# Patient Record
Sex: Female | Born: 1980 | Race: White | Hispanic: Yes | Marital: Single | Smoking: Never smoker
Health system: Southern US, Community
[De-identification: ages and names within clinical notes are randomized; demographics above are authoritative.]

## PROBLEM LIST (undated history)

## (undated) DIAGNOSIS — G43909 Migraine, unspecified, not intractable, without status migrainosus: Secondary | ICD-10-CM

## (undated) HISTORY — DX: Migraine, unspecified, not intractable, without status migrainosus: G43.909

---

## 2007-01-14 ENCOUNTER — Emergency Department (HOSPITAL_COMMUNITY): Admission: EM | Admit: 2007-01-14 | Discharge: 2007-01-14 | Payer: Self-pay | Admitting: Emergency Medicine

## 2007-12-17 ENCOUNTER — Emergency Department (HOSPITAL_COMMUNITY): Admission: EM | Admit: 2007-12-17 | Discharge: 2007-12-17 | Payer: Self-pay | Admitting: Emergency Medicine

## 2011-08-22 ENCOUNTER — Encounter: Payer: Self-pay | Admitting: Emergency Medicine

## 2011-08-22 ENCOUNTER — Emergency Department (HOSPITAL_COMMUNITY): Payer: Self-pay

## 2011-08-22 ENCOUNTER — Emergency Department (HOSPITAL_COMMUNITY)
Admission: EM | Admit: 2011-08-22 | Discharge: 2011-08-22 | Disposition: A | Discharge status: Home / Self Care | Payer: Self-pay | Attending: Emergency Medicine | Admitting: Emergency Medicine

## 2011-08-22 DIAGNOSIS — I10 Essential (primary) hypertension: Secondary | ICD-10-CM | POA: Insufficient documentation

## 2011-08-22 DIAGNOSIS — H9209 Otalgia, unspecified ear: Secondary | ICD-10-CM | POA: Insufficient documentation

## 2011-08-22 DIAGNOSIS — R51 Headache: Secondary | ICD-10-CM | POA: Insufficient documentation

## 2011-08-22 DIAGNOSIS — J40 Bronchitis, not specified as acute or chronic: Secondary | ICD-10-CM

## 2011-08-22 MED ORDER — CHLORPHENIRAMINE MALEATE CR 8 MG PO CPCR: ORAL_CAPSULE | ORAL | Status: DC

## 2011-08-22 MED ORDER — DOXYCYCLINE HYCLATE 100 MG PO TABS
100.0000 mg | ORAL_TABLET | Freq: Once | ORAL | Status: AC
  Administered 2011-08-22: 100 mg via ORAL
  Filled 2011-08-22: qty 1

## 2011-08-22 MED ORDER — HYDROCOD POLST-CHLORPHEN POLST 10-8 MG/5ML PO LQCR
5.0000 mL | Freq: Once | ORAL | Status: AC
  Administered 2011-08-22: 5 mL via ORAL
  Filled 2011-08-22: qty 5

## 2011-08-22 MED ORDER — DOXYCYCLINE HYCLATE 100 MG PO CAPS: ORAL_CAPSULE | ORAL | Status: DC

## 2011-08-22 MED ORDER — PREDNISONE 10 MG PO TABS: ORAL_TABLET | ORAL | Status: DC

## 2011-08-22 MED ORDER — ALBUTEROL SULFATE HFA 108 (90 BASE) MCG/ACT IN AERS
2.0000 | INHALATION_SPRAY | RESPIRATORY_TRACT | Status: DC
  Administered 2011-08-22: 2 via RESPIRATORY_TRACT
  Filled 2011-08-22 (×2): qty 6.7

## 2011-08-22 NOTE — ED Notes (Signed)
Pt c/o bilateral ear and and cold since October.

## 2011-08-22 NOTE — ED Provider Notes (Signed)
History     CSN: [id number] Arrival date & time: 08/22/2011 10:28 AM   First MD Initiated Contact with Patient 08/22/11 1250      Chief Complaint  Patient presents with  . Otalgia    (Consider location/radiation/quality/duration/timing/severity/associated sxs/prior treatment) Patient is a 30 y.o. female presenting with ear pain. The history is provided by the patient.  Otalgia This is a recurrent problem. The current episode started more than 1 week ago. There is pain in both ears. The problem occurs daily. The problem has been gradually worsening. The maximum temperature recorded prior to her arrival was 100 to 100.9 F. The fever has been present for 1 to 2 days. The pain is mild. Associated symptoms include headaches, rhinorrhea and sore throat. Pertinent negatives include no abdominal pain, no vomiting, no neck pain, no cough and no rash. Her past medical history does not include chronic ear infection, hearing loss or tympanostomy tube.    History reviewed. No pertinent past medical history.  History reviewed. No pertinent past surgical history.  History reviewed. No pertinent family history.  History  Substance Use Topics  . Smoking status: Not on file  . Smokeless tobacco: Not on file  . Alcohol Use: No    OB History    Grav Para Term Preterm Abortions TAB SAB Ect Mult Living                  Review of Systems  Constitutional: Negative for activity change.       All ROS Neg except as noted in HPI  HENT: Positive for ear pain, sore throat and rhinorrhea. Negative for nosebleeds and neck pain.   Eyes: Negative for photophobia and discharge.  Respiratory: Negative for cough, shortness of breath and wheezing.   Cardiovascular: Negative for chest pain and palpitations.  Gastrointestinal: Negative for vomiting, abdominal pain and blood in stool.  Genitourinary: Negative for dysuria, frequency and hematuria.  Musculoskeletal: Negative for back pain and arthralgias.    Skin: Negative.  Negative for rash.  Neurological: Positive for headaches. Negative for dizziness, seizures and speech difficulty.  Psychiatric/Behavioral: Negative for hallucinations and confusion.    Allergies  Review of patient's allergies indicates no known allergies.  Home Medications   Current Outpatient Rx  Name Route Sig Dispense Refill  . IBUPROFEN 200 MG PO TABS Oral Take 200 mg by mouth every 6 (six) hours as needed. pain     . CHLORPHENIRAMINE MALEATE CR 8 MG PO CPCR  1 po tid for congestion  Please label in SPanish 20 capsule 0  . DOXYCYCLINE HYCLATE 100 MG PO CAPS  1 po bid with food       Please label in Spanish 14 capsule 0  . PREDNISONE 10 MG PO TABS  2 po daily with food       Please label in Spanish 12 tablet 0    BP 116/71  Pulse 85  Temp(Src) 98.4 F (36.9 C) (Oral)  Resp 18  Ht 5\' 4"  (1.626 m)  Wt 140 lb (63.504 kg)  BMI 24.03 kg/m2  SpO2 96%  LMP 08/06/2011  Physical Exam  Nursing note and vitals reviewed. Constitutional: She is oriented to person, place, and time. She appears well-developed and well-nourished.  Non-toxic appearance. No distress.  HENT:  Head: Normocephalic.  Right Ear: Tympanic membrane and external ear normal.  Left Ear: Tympanic membrane and external ear normal.       Mild to mod nasal congestion. No tonsilar abscess or exudate.  Eyes: EOM and lids are normal. Pupils are equal, round, and reactive to light.  Neck: Normal range of motion. Neck supple. Carotid bruit is not present.  Cardiovascular: Normal rate, regular rhythm, normal heart sounds, intact distal pulses and normal pulses.   Pulmonary/Chest: No respiratory distress. She has rhonchi.  Abdominal: Soft. Bowel sounds are normal. There is no tenderness. There is no guarding.  Musculoskeletal: Normal range of motion.  Lymphadenopathy:       Head (right side): No submandibular adenopathy present.       Head (left side): No submandibular adenopathy present.    She has no  cervical adenopathy.  Neurological: She is alert and oriented to person, place, and time. She has normal strength. No cranial nerve deficit or sensory deficit.  Skin: Skin is warm and dry.  Psychiatric: She has a normal mood and affect. Her speech is normal.    ED Course  Procedures (including critical care time)  Labs Reviewed - No data to display No results found.   1. Otalgia   2. Bronchitis       MDM  I have reviewed nursing notes, vital signs, and all appropriate lab and imaging results for this patient.        Kathie Dike, [place] 08/22/11 1728

## 2011-08-22 NOTE — ED Notes (Signed)
Pt states has had cold like symptoms x 1 month, ear pain x 4 days with bloody drainage from rt ear.  Pt states has productive cough with yellow sputum, expiratory wheezing audible and crackles throughout lung field. Pt speaks very little english. Able to translate effectively.

## 2011-08-24 NOTE — ED Provider Notes (Signed)
Medical screening examination/treatment/procedure(s) were performed by non-physician practitioner and as supervising physician I was immediately available for consultation/collaboration.  Nicoletta Dress. Colon Branch, MD 08/24/11 1121

## 2011-09-09 ENCOUNTER — Encounter (HOSPITAL_COMMUNITY): Payer: Self-pay | Admitting: *Deleted

## 2011-09-09 ENCOUNTER — Emergency Department (HOSPITAL_COMMUNITY)
Admission: EM | Admit: 2011-09-09 | Discharge: 2011-09-09 | Disposition: A | Discharge status: Home / Self Care | Payer: Self-pay | Attending: Emergency Medicine | Admitting: Emergency Medicine

## 2011-09-09 DIAGNOSIS — R509 Fever, unspecified: Secondary | ICD-10-CM | POA: Insufficient documentation

## 2011-09-09 DIAGNOSIS — H9209 Otalgia, unspecified ear: Secondary | ICD-10-CM | POA: Insufficient documentation

## 2011-09-09 DIAGNOSIS — Z79899 Other long term (current) drug therapy: Secondary | ICD-10-CM | POA: Insufficient documentation

## 2011-09-09 DIAGNOSIS — J020 Streptococcal pharyngitis: Secondary | ICD-10-CM | POA: Insufficient documentation

## 2011-09-09 DIAGNOSIS — IMO0001 Reserved for inherently not codable concepts without codable children: Secondary | ICD-10-CM | POA: Insufficient documentation

## 2011-09-09 LAB — RAPID STREP SCREEN (MED CTR MEBANE ONLY): Streptococcus, Group A Screen (Direct): POSITIVE — AB

## 2011-09-09 MED ORDER — IBUPROFEN 800 MG PO TABS
800.0000 mg | ORAL_TABLET | Freq: Once | ORAL | Status: AC
  Administered 2011-09-09: 800 mg via ORAL
  Filled 2011-09-09: qty 1

## 2011-09-09 MED ORDER — AMOXICILLIN 500 MG PO CAPS: 500.0000 mg | ORAL_CAPSULE | Freq: Three times a day (TID) | ORAL | Status: AC

## 2011-09-09 NOTE — ED Notes (Signed)
Fever, sore throat, body aches, and ear pain that started last night.  Last took advil approx 0300.

## 2011-09-09 NOTE — ED Provider Notes (Signed)
Medical screening examination/treatment/procedure(s) were performed by non-physician practitioner and as supervising physician I was immediately available for consultation/collaboration.  Marquez Ceesay T Labrenda Lasky, MD 09/09/11 1449 

## 2011-09-09 NOTE — ED Provider Notes (Signed)
History     CSN: [id number]  Arrival date & time 09/09/11  0806   First MD Initiated Contact with Patient 09/09/11 [phone]      Chief Complaint  Patient presents with  . Sore Throat  . Fever  . Generalized Body Aches    (Consider location/radiation/quality/duration/timing/severity/associated sxs/prior treatment) Patient is a 30 y.o. female presenting with pharyngitis and fever. The history is provided by the patient.  Sore Throat This is a new problem. The current episode started yesterday. The problem occurs constantly. The problem has been unchanged. Associated symptoms include a fever, myalgias and a sore throat. Pertinent negatives include no abdominal pain, arthralgias, chest pain, congestion, headaches, joint swelling, nausea, neck pain, numbness, rash or weakness. Associated symptoms comments: Ear pain. The symptoms are aggravated by swallowing. She has tried NSAIDs for the symptoms. The treatment provided mild relief.  Fever Primary symptoms of the febrile illness include fever and myalgias. Primary symptoms do not include headaches, shortness of breath, abdominal pain, nausea, arthralgias or rash.  The myalgias are not associated with weakness.    History reviewed. No pertinent past medical history.  History reviewed. No pertinent past surgical history.  No family history on file.  History  Substance Use Topics  . Smoking status: Never Smoker   . Smokeless tobacco: Not on file  . Alcohol Use: No    OB History    Grav Para Term Preterm Abortions TAB SAB Ect Mult Living                  Review of Systems  Constitutional: Positive for fever.  HENT: Positive for sore throat. Negative for congestion and neck pain.   Eyes: Negative.   Respiratory: Negative for chest tightness and shortness of breath.   Cardiovascular: Negative for chest pain.  Gastrointestinal: Negative for nausea and abdominal pain.  Genitourinary: Negative.   Musculoskeletal: Positive for  myalgias. Negative for joint swelling and arthralgias.  Skin: Negative.  Negative for rash and wound.  Neurological: Negative for dizziness, weakness, light-headedness, numbness and headaches.  Hematological: Negative.   Psychiatric/Behavioral: Negative.     Allergies  Review of patient's allergies indicates no known allergies.  Home Medications   Current Outpatient Rx  Name Route Sig Dispense Refill  . CHLORPHENIRAMINE MALEATE CR 8 MG PO CPCR  1 po tid for congestion  Please label in SPanish 20 capsule 0  . DOXYCYCLINE HYCLATE 100 MG PO CAPS  1 po bid with food       Please label in Spanish 14 capsule 0  . IBUPROFEN 200 MG PO TABS Oral Take 200 mg by mouth every 6 (six) hours as needed. pain     . PREDNISONE 10 MG PO TABS  2 po daily with food       Please label in Spanish 12 tablet 0    BP 116/69  Pulse 82  Temp(Src) 98.1 F (36.7 C) (Oral)  Resp 16  Wt 141 lb (63.957 kg)  SpO2 100%  LMP 08/06/2011  Physical Exam  Nursing note and vitals reviewed. Constitutional: She is oriented to person, place, and time. She appears well-developed and well-nourished.  HENT:  Head: Normocephalic and atraumatic.  Right Ear: Tympanic membrane normal.  Left Ear: Tympanic membrane normal.  Nose: Nose normal.  Mouth/Throat: Uvula is midline. Posterior oropharyngeal erythema present. No oropharyngeal exudate, posterior oropharyngeal edema or tonsillar abscesses.  Eyes: Conjunctivae are normal.  Neck: Normal range of motion.  Cardiovascular: Normal rate, regular rhythm, normal heart sounds  and intact distal pulses.   Pulmonary/Chest: Effort normal and breath sounds normal. No stridor. She has no wheezes.  Abdominal: Soft. Bowel sounds are normal. There is no tenderness.  Musculoskeletal: Normal range of motion.  Lymphadenopathy:    She has no cervical adenopathy.  Neurological: She is alert and oriented to person, place, and time.  Skin: Skin is warm and dry.  Psychiatric: She has a normal  mood and affect.    ED Course  Procedures (including critical care time)  Labs Reviewed  RAPID STREP SCREEN - Abnormal; Notable for the following:    Streptococcus, Group A Screen (Direct) POSITIVE (*)    All other components within normal limits   No results found.   No diagnosis found.    MDM  Strep positive.  Amoxil.  Motrin or tylenol.          Candis Musa, PA 09/09/11 [phone]

## 2011-09-09 NOTE — ED Notes (Signed)
Pt c/o headache, fever, earache bilaterally, aching all over and sore throat since yesterday. Pt alert and oriented x 3. Skin warm and dry. Color pink. Pt has beefy red throat with white patches.

## 2011-12-18 ENCOUNTER — Other Ambulatory Visit (HOSPITAL_COMMUNITY)
Admission: RE | Admit: 2011-12-18 | Discharge: 2011-12-18 | Disposition: A | Discharge status: Home / Self Care | Payer: Self-pay | Source: Ambulatory Visit | Attending: Unknown Physician Specialty | Admitting: Unknown Physician Specialty

## 2011-12-18 DIAGNOSIS — R87619 Unspecified abnormal cytological findings in specimens from cervix uteri: Secondary | ICD-10-CM | POA: Insufficient documentation

## 2011-12-18 DIAGNOSIS — R87612 Low grade squamous intraepithelial lesion on cytologic smear of cervix (LGSIL): Secondary | ICD-10-CM | POA: Insufficient documentation

## 2012-08-19 ENCOUNTER — Other Ambulatory Visit (HOSPITAL_COMMUNITY)
Admission: RE | Admit: 2012-08-19 | Discharge: 2012-08-19 | Disposition: A | Discharge status: Home / Self Care | Payer: Self-pay | Source: Ambulatory Visit | Attending: Unknown Physician Specialty | Admitting: Unknown Physician Specialty

## 2012-08-19 ENCOUNTER — Other Ambulatory Visit: Payer: Self-pay | Admitting: Nurse Practitioner

## 2012-08-19 DIAGNOSIS — N72 Inflammatory disease of cervix uteri: Secondary | ICD-10-CM | POA: Insufficient documentation

## 2012-08-19 DIAGNOSIS — R87612 Low grade squamous intraepithelial lesion on cytologic smear of cervix (LGSIL): Secondary | ICD-10-CM | POA: Insufficient documentation

## 2012-08-19 DIAGNOSIS — N870 Mild cervical dysplasia: Secondary | ICD-10-CM | POA: Insufficient documentation

## 2012-12-04 ENCOUNTER — Other Ambulatory Visit (HOSPITAL_COMMUNITY): Payer: Self-pay | Admitting: Nurse Practitioner

## 2012-12-04 DIAGNOSIS — R1032 Left lower quadrant pain: Secondary | ICD-10-CM

## 2012-12-11 ENCOUNTER — Ambulatory Visit (HOSPITAL_COMMUNITY)
Admission: RE | Admit: 2012-12-11 | Discharge: 2012-12-11 | Disposition: A | Discharge status: Home / Self Care | Payer: Self-pay | Source: Ambulatory Visit | Attending: Family Medicine | Admitting: Family Medicine

## 2012-12-11 ENCOUNTER — Other Ambulatory Visit (HOSPITAL_COMMUNITY): Payer: Self-pay | Admitting: Nurse Practitioner

## 2012-12-11 DIAGNOSIS — R1032 Left lower quadrant pain: Secondary | ICD-10-CM

## 2013-05-20 IMAGING — US US TRANSVAGINAL NON-OB
1 series · 13 of 25 positions shown · non-contrast
Comparison: None

CLINICAL DATA: Left lower quadrant pain for 2 weeks.  On Depo-
Provera.



[Series 1: us transvaginal non-ob · 0.21mm/px · 13 of 83 slices shown]
[im 1/83]
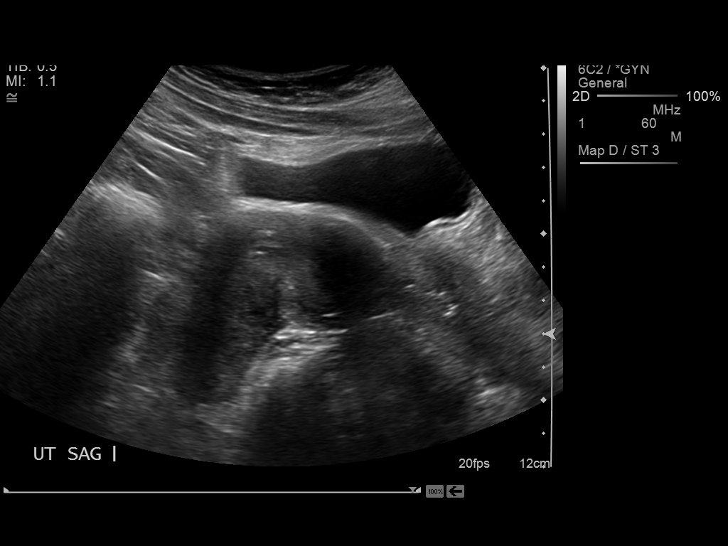
[im 7/83]
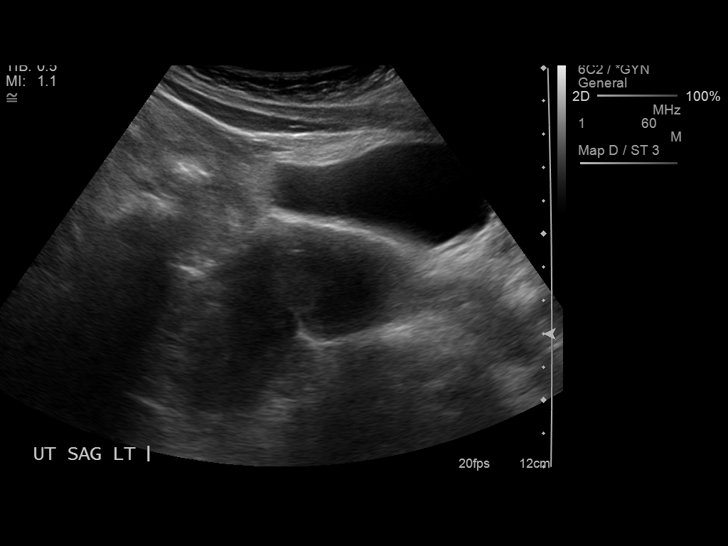
[im 14/83]
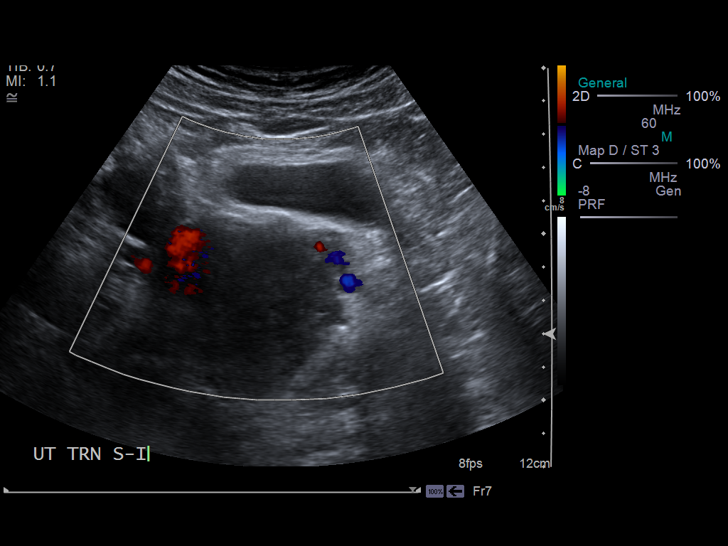
[im 21/83]
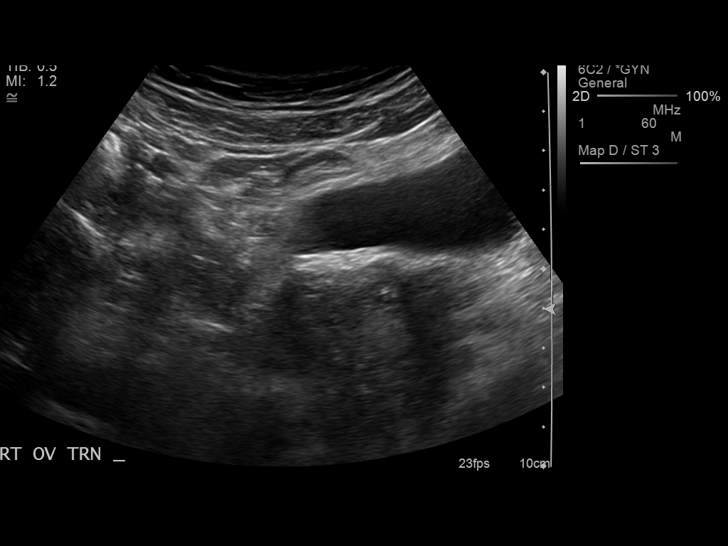
[im 28/83]
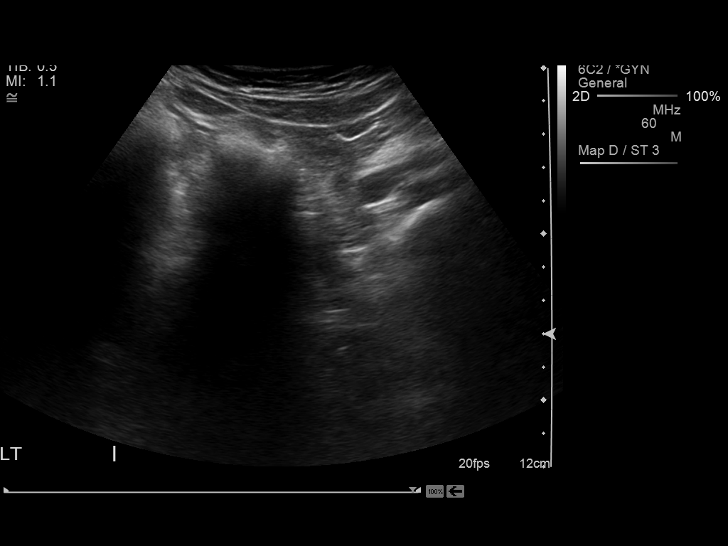
[im 35/83]
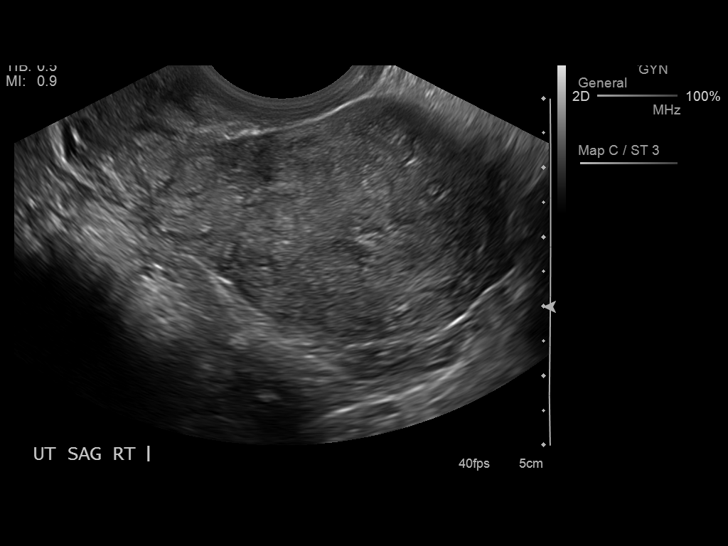
[im 42/83]
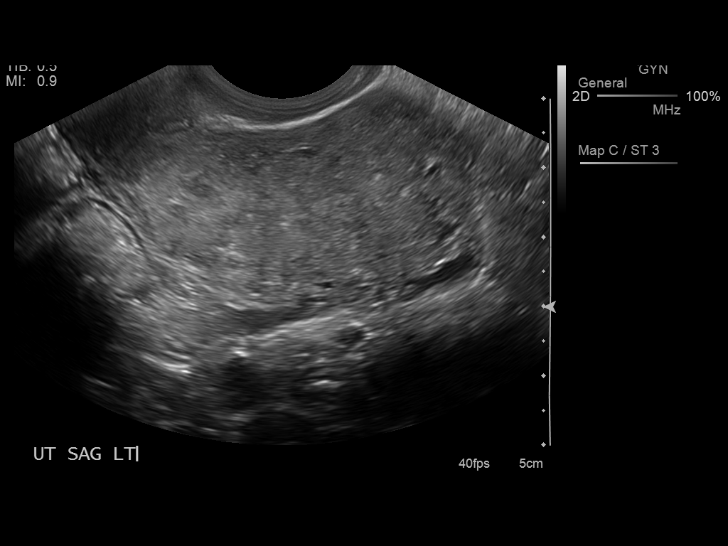
[im 48/83]
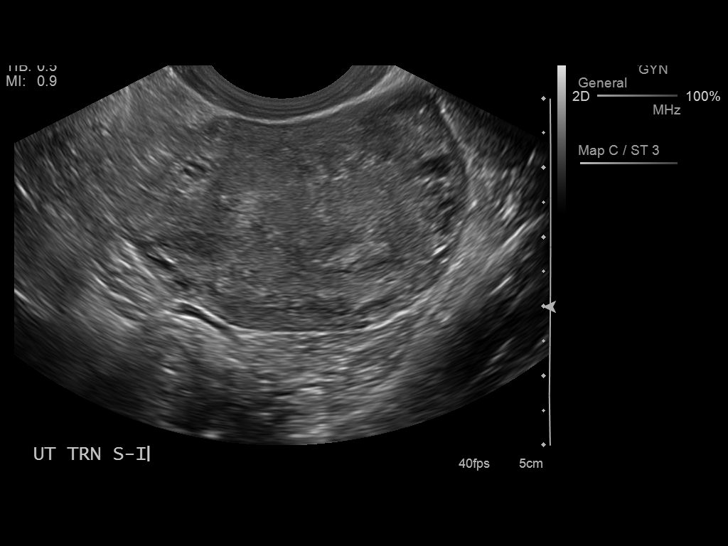
[im 55/83]
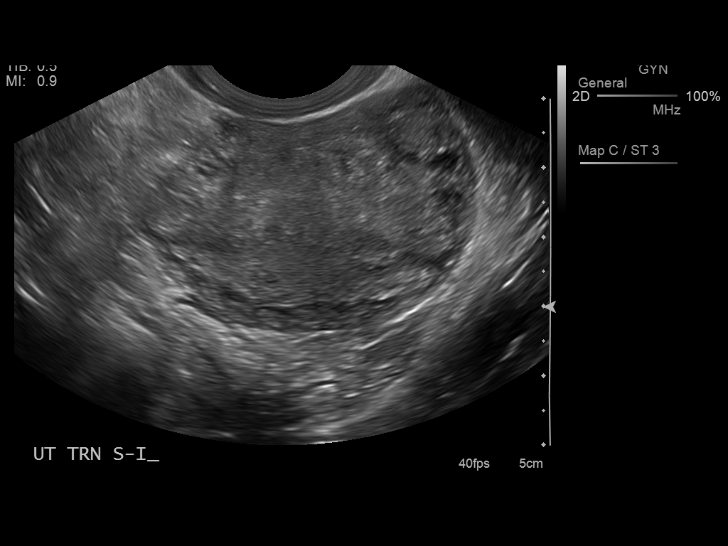
[im 62/83]
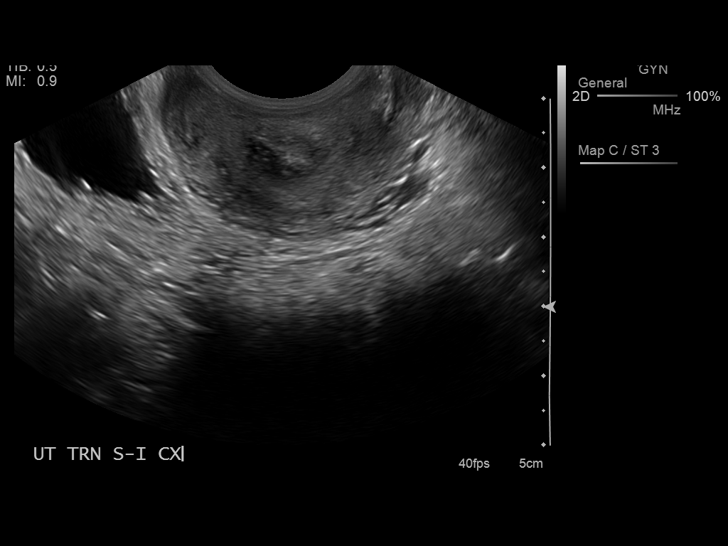
[im 69/83]
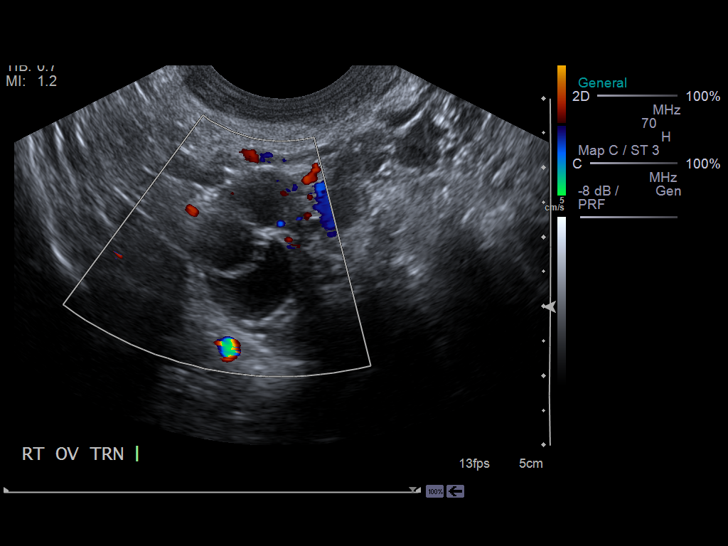
[im 76/83]
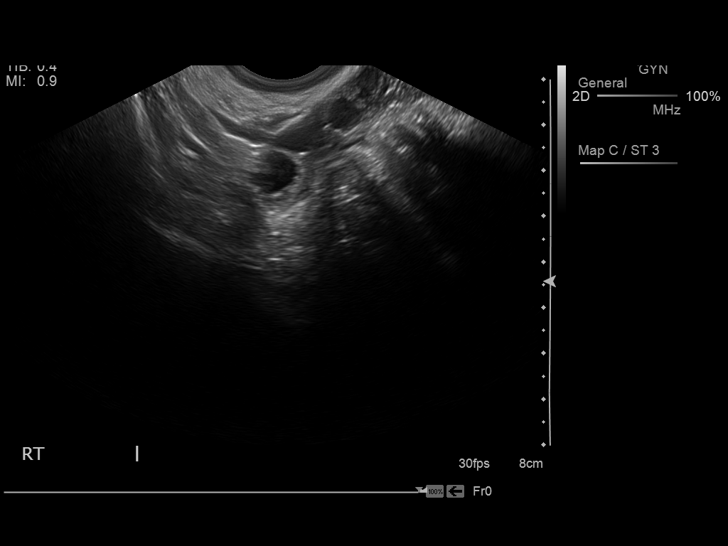
[im 83/83]
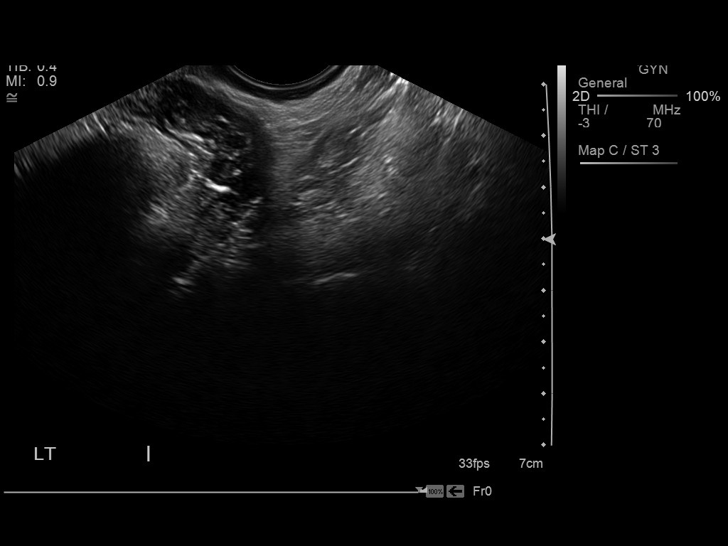

[13 of 25 positions shown; findings below may reference images not displayed]

FINDINGS: Uterus: Is retroverted and retroflexed and has a sagittal length of
6.8 cm, depth of 4.2 cm and width of 5.2 cm.  A homogeneous
myometrium is seen with no focal parenchymal abnormality evident

Endometrium: Is thin and echogenic with a width of 3.1 mm.  No
areas of focal thickening or heterogeneity are seen and this would
correlate with the provided history of Depo-Provera use.

Right ovary:  Measures 2.4 x 1.6 x 1.5 cm and contains several
follicles

Left ovary: Is 2.3 x 1.0 x 0.7 cm and contains a solitary follicle

Other findings: No pelvic fluid or separate adnexal masses are
seen.
IMPRESSION: Unremarkable pelvic ultrasound..

## 2019-06-15 ENCOUNTER — Ambulatory Visit (INDEPENDENT_AMBULATORY_CARE_PROVIDER_SITE_OTHER): Payer: Self-pay | Admitting: Otolaryngology

## 2019-06-15 DIAGNOSIS — H900 Conductive hearing loss, bilateral: Secondary | ICD-10-CM

## 2019-06-15 DIAGNOSIS — H6122 Impacted cerumen, left ear: Secondary | ICD-10-CM

## 2019-07-02 ENCOUNTER — Ambulatory Visit (INDEPENDENT_AMBULATORY_CARE_PROVIDER_SITE_OTHER): Payer: Self-pay | Admitting: Otolaryngology

## 2022-11-23 ENCOUNTER — Other Ambulatory Visit: Payer: Self-pay | Admitting: Nurse Practitioner

## 2022-11-23 DIAGNOSIS — Z1231 Encounter for screening mammogram for malignant neoplasm of breast: Secondary | ICD-10-CM

## 2022-12-12 ENCOUNTER — Ambulatory Visit (HOSPITAL_COMMUNITY)
Admission: RE | Admit: 2022-12-12 | Discharge: 2022-12-12 | Disposition: A | Discharge status: Home / Self Care | Payer: Self-pay | Source: Ambulatory Visit | Attending: Nurse Practitioner | Admitting: Nurse Practitioner

## 2022-12-12 DIAGNOSIS — Z1231 Encounter for screening mammogram for malignant neoplasm of breast: Secondary | ICD-10-CM | POA: Insufficient documentation

## 2022-12-18 ENCOUNTER — Telehealth: Payer: Self-pay

## 2022-12-18 NOTE — Telephone Encounter (Signed)
Telephoned patient at home and mobile number using interpreter # [phone]. Left a voice message with [facility] contact information.

## 2022-12-20 ENCOUNTER — Other Ambulatory Visit (HOSPITAL_COMMUNITY): Payer: Self-pay | Admitting: Obstetrics and Gynecology

## 2022-12-20 DIAGNOSIS — R928 Other abnormal and inconclusive findings on diagnostic imaging of breast: Secondary | ICD-10-CM

## 2022-12-28 ENCOUNTER — Inpatient Hospital Stay: Payer: Self-pay | Attending: Obstetrics and Gynecology | Admitting: Hematology and Oncology

## 2022-12-28 VITALS — BP 128/80 | Wt 156.8 lb

## 2022-12-28 DIAGNOSIS — N6320 Unspecified lump in the left breast, unspecified quadrant: Secondary | ICD-10-CM

## 2022-12-28 NOTE — Progress Notes (Signed)
[name] is a 42 y.o. female who presents to [facility] clinic today with no complaints. Call back from screening mammogram with possible right asymmetry and possible left breast mass.    Pap Smear: Pap not smear completed today. Last Pap smear was 2023 at [facility] clinic and was normal. Per patient has history of an abnormal Pap smear. Last Pap smear result is available in Epic. 08/19/12 - [facility] followed by colposcopy with [facility]/ [facility]. She is scheduled for Pap smear in June this year with Allison Elliott and understands that she will need a Pap smear annually for 3 normal results.    Physical exam: Breasts Breasts symmetrical. No skin abnormalities bilateral breasts. No nipple retraction bilateral breasts. No nipple discharge bilateral breasts. No lymphadenopathy. No lumps palpated bilateral breasts. MS 3D SCR MAMMO BILAT BR (aka MM)  Result Date: 12/13/2022 CLINICAL DATA:  Screening. EXAM: DIGITAL SCREENING BILATERAL MAMMOGRAM WITH TOMOSYNTHESIS AND CAD TECHNIQUE: Bilateral screening digital craniocaudal and mediolateral oblique mammograms were obtained. Bilateral screening digital breast tomosynthesis was performed. The images were evaluated with computer-aided detection. COMPARISON:  None. ACR Breast Density Category c: The breasts are heterogeneously dense, which may obscure small masses. FINDINGS: In the right breast a possible asymmetry requires further evaluation. In the left breast a mass requires further evaluation. IMPRESSION: Further evaluation is suggested for possible asymmetry in the right breast. Further evaluation is suggested for possible mass in the left breast. RECOMMENDATION: Diagnostic mammogram and possibly ultrasound of both breasts. (Code:[id number]) The patient will be contacted regarding the findings, and additional imaging will be scheduled. BI-RADS CATEGORY  0: Incomplete: Need additional imaging evaluation. Electronically Signed   By: Allison Elliott M.D.   On: 12/13/2022 10:31           Pelvic/Bimanual Pap is not indicated today    Smoking History: Patient has never smoked and was not referred to quit line.    Patient Navigation: Patient education provided. Access to services provided for patient through [facility] program. [place] interpreter provided. No transportation provided   Colorectal Cancer Screening: Per patient has never had colonoscopy completed No complaints today.    Breast and Cervical Cancer Risk Assessment: Patient does not have family history of breast cancer, known genetic mutations, or radiation treatment to the chest before age 65. Patient has history of cervical dysplasia, immunocompromised, or DES exposure in-utero.  Risk Assessment   No risk assessment data     A: [facility] exam without pap smear No complaints. Callback for possible asymmetry of the right breast and possible mass in the left breast with normal exam.   P: [facility]. Appointment scheduled 01/03/23.  Pascal Lux, NP 12/28/2022 9:52 AM

## 2022-12-28 NOTE — Patient Instructions (Signed)
Taught Allison Elliott about self breast exam and gave educational materials to take home. Patient did not need a Pap smear today due to last Pap smear was in 2023 per patient. She will repeat Pap smear in June this year as she has a history of abnormal Pap smears with one normal.  Let her know [facility] will cover Pap smears every 5 years unless has a history of abnormal Pap smears. Referred [facility] to [facility] for diagnostic mammogram. Appointment scheduled for 01/03/23. Patient aware of appointment and will be there. Let patient know will follow up with her within the next couple weeks with results. Allison Elliott verbalized understanding.  Pascal Lux, NP 9:53 AM

## 2023-01-03 ENCOUNTER — Ambulatory Visit (HOSPITAL_COMMUNITY)
Admission: RE | Admit: 2023-01-03 | Discharge: 2023-01-03 | Disposition: A | Discharge status: Home / Self Care | Payer: Self-pay | Source: Ambulatory Visit | Attending: Obstetrics and Gynecology | Admitting: Obstetrics and Gynecology

## 2023-01-03 DIAGNOSIS — R928 Other abnormal and inconclusive findings on diagnostic imaging of breast: Secondary | ICD-10-CM

## 2023-11-25 ENCOUNTER — Other Ambulatory Visit: Payer: Self-pay | Admitting: Obstetrics and Gynecology

## 2023-11-25 DIAGNOSIS — Z1231 Encounter for screening mammogram for malignant neoplasm of breast: Secondary | ICD-10-CM

## 2023-12-13 ENCOUNTER — Ambulatory Visit: Payer: Self-pay

## 2023-12-17 ENCOUNTER — Other Ambulatory Visit: Payer: Self-pay | Admitting: Nurse Practitioner

## 2023-12-17 DIAGNOSIS — Z1231 Encounter for screening mammogram for malignant neoplasm of breast: Secondary | ICD-10-CM

## 2024-01-10 ENCOUNTER — Encounter (HOSPITAL_COMMUNITY): Payer: Self-pay

## 2024-01-10 ENCOUNTER — Ambulatory Visit (HOSPITAL_COMMUNITY)
Admission: RE | Admit: 2024-01-10 | Discharge: 2024-01-10 | Disposition: A | Discharge status: Home / Self Care | Payer: Self-pay | Source: Ambulatory Visit | Attending: Obstetrics and Gynecology | Admitting: Obstetrics and Gynecology

## 2024-01-10 ENCOUNTER — Inpatient Hospital Stay: Payer: Self-pay

## 2024-01-10 DIAGNOSIS — Z1231 Encounter for screening mammogram for malignant neoplasm of breast: Secondary | ICD-10-CM | POA: Insufficient documentation
